# Patient Record
Sex: Female | Born: 1950
Health system: Southern US, Community
[De-identification: ages and names within clinical notes are randomized; demographics above are authoritative.]

## PROBLEM LIST (undated history)

## (undated) DIAGNOSIS — D649 Anemia, unspecified: Secondary | ICD-10-CM

## (undated) DIAGNOSIS — I10 Essential (primary) hypertension: Secondary | ICD-10-CM

## (undated) DIAGNOSIS — D219 Benign neoplasm of connective and other soft tissue, unspecified: Secondary | ICD-10-CM

## (undated) DIAGNOSIS — Z8742 Personal history of other diseases of the female genital tract: Secondary | ICD-10-CM

## (undated) DIAGNOSIS — N951 Menopausal and female climacteric states: Secondary | ICD-10-CM

## (undated) DIAGNOSIS — R011 Cardiac murmur, unspecified: Secondary | ICD-10-CM

## (undated) HISTORY — DX: Essential (primary) hypertension: I10

## (undated) HISTORY — DX: Cardiac murmur, unspecified: R01.1

## (undated) HISTORY — DX: Anemia, unspecified: D64.9

## (undated) HISTORY — DX: Menopausal and female climacteric states: N95.1

## (undated) HISTORY — DX: Benign neoplasm of connective and other soft tissue, unspecified: D21.9

## (undated) HISTORY — DX: Personal history of other diseases of the female genital tract: Z87.42

---

## 1988-04-28 HISTORY — PX: MYOMECTOMY: SHX85

## 1994-04-28 DIAGNOSIS — R011 Cardiac murmur, unspecified: Secondary | ICD-10-CM

## 1994-04-28 HISTORY — DX: Cardiac murmur, unspecified: R01.1

## 1997-10-19 ENCOUNTER — Other Ambulatory Visit: Admission: RE | Admit: 1997-10-19 | Discharge: 1997-10-19 | Payer: Self-pay | Admitting: Obstetrics and Gynecology

## 1998-02-23 ENCOUNTER — Encounter: Payer: Self-pay | Admitting: Obstetrics and Gynecology

## 1998-02-23 ENCOUNTER — Ambulatory Visit (HOSPITAL_COMMUNITY): Admission: RE | Admit: 1998-02-23 | Discharge: 1998-02-23 | Payer: Self-pay | Admitting: Obstetrics and Gynecology

## 1998-03-09 ENCOUNTER — Ambulatory Visit (HOSPITAL_COMMUNITY): Admission: RE | Admit: 1998-03-09 | Discharge: 1998-03-09 | Payer: Self-pay | Admitting: Obstetrics and Gynecology

## 1998-03-09 ENCOUNTER — Encounter: Payer: Self-pay | Admitting: Obstetrics and Gynecology

## 1998-09-21 ENCOUNTER — Ambulatory Visit (HOSPITAL_COMMUNITY): Admission: RE | Admit: 1998-09-21 | Discharge: 1998-09-21 | Payer: Self-pay | Admitting: Obstetrics and Gynecology

## 1999-01-07 ENCOUNTER — Other Ambulatory Visit: Admission: RE | Admit: 1999-01-07 | Discharge: 1999-01-07 | Payer: Self-pay | Admitting: Obstetrics and Gynecology

## 1999-04-29 DIAGNOSIS — N951 Menopausal and female climacteric states: Secondary | ICD-10-CM

## 1999-04-29 HISTORY — DX: Menopausal and female climacteric states: N95.1

## 2000-01-28 ENCOUNTER — Other Ambulatory Visit: Admission: RE | Admit: 2000-01-28 | Discharge: 2000-01-28 | Payer: Self-pay | Admitting: Obstetrics and Gynecology

## 2000-02-26 ENCOUNTER — Encounter: Payer: Self-pay | Admitting: Obstetrics and Gynecology

## 2000-02-26 ENCOUNTER — Ambulatory Visit (HOSPITAL_COMMUNITY): Admission: RE | Admit: 2000-02-26 | Discharge: 2000-02-26 | Payer: Self-pay | Admitting: Family Medicine

## 2001-01-27 ENCOUNTER — Other Ambulatory Visit: Admission: RE | Admit: 2001-01-27 | Discharge: 2001-01-27 | Payer: Self-pay | Admitting: Obstetrics and Gynecology

## 2001-03-01 ENCOUNTER — Encounter: Payer: Self-pay | Admitting: Obstetrics and Gynecology

## 2001-03-01 ENCOUNTER — Ambulatory Visit (HOSPITAL_COMMUNITY): Admission: RE | Admit: 2001-03-01 | Discharge: 2001-03-01 | Payer: Self-pay | Admitting: Obstetrics and Gynecology

## 2002-01-28 ENCOUNTER — Other Ambulatory Visit: Admission: RE | Admit: 2002-01-28 | Discharge: 2002-01-28 | Payer: Self-pay | Admitting: Obstetrics and Gynecology

## 2002-03-08 ENCOUNTER — Ambulatory Visit (HOSPITAL_COMMUNITY): Admission: RE | Admit: 2002-03-08 | Discharge: 2002-03-08 | Payer: Self-pay | Admitting: Obstetrics and Gynecology

## 2002-03-08 ENCOUNTER — Encounter: Payer: Self-pay | Admitting: Obstetrics and Gynecology

## 2003-03-15 ENCOUNTER — Ambulatory Visit (HOSPITAL_COMMUNITY): Admission: RE | Admit: 2003-03-15 | Discharge: 2003-03-15 | Payer: Self-pay | Admitting: Obstetrics and Gynecology

## 2004-03-19 ENCOUNTER — Ambulatory Visit (HOSPITAL_COMMUNITY): Admission: RE | Admit: 2004-03-19 | Discharge: 2004-03-19 | Payer: Self-pay | Admitting: Obstetrics and Gynecology

## 2004-04-03 ENCOUNTER — Other Ambulatory Visit: Admission: RE | Admit: 2004-04-03 | Discharge: 2004-04-03 | Payer: Self-pay | Admitting: Obstetrics and Gynecology

## 2005-04-07 ENCOUNTER — Other Ambulatory Visit: Admission: RE | Admit: 2005-04-07 | Discharge: 2005-04-07 | Payer: Self-pay | Admitting: Obstetrics and Gynecology

## 2005-04-14 ENCOUNTER — Ambulatory Visit (HOSPITAL_COMMUNITY): Admission: RE | Admit: 2005-04-14 | Discharge: 2005-04-14 | Payer: Self-pay | Admitting: Obstetrics and Gynecology

## 2005-05-07 ENCOUNTER — Encounter: Admission: RE | Admit: 2005-05-07 | Discharge: 2005-05-07 | Payer: Self-pay | Admitting: Obstetrics and Gynecology

## 2005-12-10 ENCOUNTER — Encounter: Admission: RE | Admit: 2005-12-10 | Discharge: 2005-12-10 | Payer: Self-pay | Admitting: Obstetrics and Gynecology

## 2006-08-10 ENCOUNTER — Encounter: Admission: RE | Admit: 2006-08-10 | Discharge: 2006-08-10 | Payer: Self-pay | Admitting: Obstetrics and Gynecology

## 2006-12-04 ENCOUNTER — Emergency Department (HOSPITAL_COMMUNITY): Admission: EM | Admit: 2006-12-04 | Discharge: 2006-12-04 | Payer: Self-pay | Admitting: Emergency Medicine

## 2007-08-16 ENCOUNTER — Encounter: Admission: RE | Admit: 2007-08-16 | Discharge: 2007-08-16 | Payer: Self-pay | Admitting: Obstetrics and Gynecology

## 2008-08-29 ENCOUNTER — Encounter: Admission: RE | Admit: 2008-08-29 | Discharge: 2008-08-29 | Payer: Self-pay | Admitting: Obstetrics and Gynecology

## 2009-08-31 ENCOUNTER — Encounter: Admission: RE | Admit: 2009-08-31 | Discharge: 2009-08-31 | Payer: Self-pay | Admitting: Obstetrics and Gynecology

## 2010-05-19 ENCOUNTER — Encounter: Payer: Self-pay | Admitting: Obstetrics and Gynecology

## 2010-08-13 ENCOUNTER — Other Ambulatory Visit: Payer: Self-pay | Admitting: Obstetrics and Gynecology

## 2010-08-13 DIAGNOSIS — Z1231 Encounter for screening mammogram for malignant neoplasm of breast: Secondary | ICD-10-CM

## 2010-09-24 ENCOUNTER — Ambulatory Visit
Admission: RE | Admit: 2010-09-24 | Discharge: 2010-09-24 | Disposition: A | Discharge status: Home / Self Care | Payer: BC Managed Care – PPO | Source: Ambulatory Visit | Attending: Obstetrics and Gynecology | Admitting: Obstetrics and Gynecology

## 2010-09-24 DIAGNOSIS — Z1231 Encounter for screening mammogram for malignant neoplasm of breast: Secondary | ICD-10-CM

## 2011-08-11 ENCOUNTER — Encounter: Payer: Self-pay | Admitting: Obstetrics and Gynecology

## 2011-08-11 ENCOUNTER — Ambulatory Visit (INDEPENDENT_AMBULATORY_CARE_PROVIDER_SITE_OTHER): Payer: BC Managed Care – PPO | Admitting: Obstetrics and Gynecology

## 2011-08-11 VITALS — BP 130/74 | Resp 16 | Ht 60.0 in | Wt 112.0 lb

## 2011-08-11 DIAGNOSIS — I10 Essential (primary) hypertension: Secondary | ICD-10-CM

## 2011-08-11 DIAGNOSIS — Z1231 Encounter for screening mammogram for malignant neoplasm of breast: Secondary | ICD-10-CM

## 2011-08-11 DIAGNOSIS — Z01419 Encounter for gynecological examination (general) (routine) without abnormal findings: Secondary | ICD-10-CM

## 2011-08-11 DIAGNOSIS — N951 Menopausal and female climacteric states: Secondary | ICD-10-CM

## 2011-08-11 NOTE — Progress Notes (Signed)
Contraception: Meno Last pap: 08/08/10 Last Mammo 09/24/2010 Last Colonoscopy: None Last Dexa Scan: None Primary MD: Ok Anis Abuse at Home: No

## 2011-08-11 NOTE — Progress Notes (Signed)
Subjective:    Paula Harper is a 61 y.o. female who presents for annual exam. The patient has no complaints today. The patient is sexually active. GYN screening history: last pap: was normal and last mammogram: was normal. The patient is not taking hormone replacement therapy. Patient denies post-menopausal vaginal bleeding.. The patient wears seatbelts: yes. The patient participates in regular exercise: yes. Has the patient ever been transfused or tattooed?: no. The patient reports that there is not domestic violence in her life.   Menstrual History: OB History    Grav Para Term Preterm Abortions TAB SAB Ect Mult Living                  Menarche age: 30 No LMP recorded. Patient is postmenopausal.    The following portions of the patient's history were reviewed and updated as appropriate: allergies, current medications, past family history, past medical history, past social history, past surgical history and problem list.  Review of Systems A comprehensive review of systems was negative.    Objective:    BP 130/74  Resp 16  Ht 5' (1.524 m)  Wt 112 lb (50.803 kg)  BMI 21.87 kg/m2  General Appearance:    Alert, cooperative, no distress, appears stated age  Head:    Normocephalic, without obvious abnormality, atraumatic  Eyes:    PERRL, conjunctiva/corneas clear, EOM's intact, fundi    benign, both eyes  Ears:    Normal TM's and external ear canals, both ears  Nose:   Nares normal, septum midline, mucosa normal, no drainage    or sinus tenderness  Throat:   Lips, mucosa, and tongue normal; teeth and gums normal  Neck:   Supple, symmetrical, trachea midline, no adenopathy;    thyroid:  no enlargement/tenderness/nodules; no carotid   bruit or JVD  Back:     Symmetric, no curvature, ROM normal, no CVA tenderness  Lungs:     Clear to auscultation bilaterally, respirations unlabored  Chest Wall:    No tenderness or deformity   Heart:    Regular rate and rhythm, S1 and S2 normal, no  murmur, rub   or gallop  Breast Exam:    No tenderness, masses, or nipple abnormality  Abdomen:     Soft, non-tender, bowel sounds active all four quadrants,    no masses, no organomegaly  Genitalia:    Normal female without lesion, discharge or tenderness  Rectal:    Normal tone, normal prostate, no masses or tenderness;   guaiac negative stool  Extremities:   Extremities normal, atraumatic, no cyanosis or edema  Pulses:   2+ and symmetric all extremities  Skin:   Skin color, texture, turgor normal, no rashes or lesions  Lymph nodes:   Cervical, supraclavicular, and axillary nodes normal  Neurologic:   CNII-XII intact, normal strength, sensation and reflexes    throughout      Assessment:    Normal gyn exam    Plan:    Follow up as needed. Mammogram. Diet and exercise reviewed.  Needs sigmoidoscopy.

## 2011-09-29 ENCOUNTER — Ambulatory Visit
Admission: RE | Admit: 2011-09-29 | Discharge: 2011-09-29 | Disposition: A | Discharge status: Home / Self Care | Payer: BC Managed Care – PPO | Source: Ambulatory Visit | Attending: Obstetrics and Gynecology | Admitting: Obstetrics and Gynecology

## 2011-09-29 DIAGNOSIS — Z1231 Encounter for screening mammogram for malignant neoplasm of breast: Secondary | ICD-10-CM

## 2011-10-02 ENCOUNTER — Other Ambulatory Visit: Payer: Self-pay | Admitting: Obstetrics and Gynecology

## 2011-10-02 DIAGNOSIS — R928 Other abnormal and inconclusive findings on diagnostic imaging of breast: Secondary | ICD-10-CM

## 2011-10-14 ENCOUNTER — Ambulatory Visit
Admission: RE | Admit: 2011-10-14 | Discharge: 2011-10-14 | Disposition: A | Discharge status: Home / Self Care | Payer: BC Managed Care – PPO | Source: Ambulatory Visit | Attending: Obstetrics and Gynecology | Admitting: Obstetrics and Gynecology

## 2011-10-14 DIAGNOSIS — R928 Other abnormal and inconclusive findings on diagnostic imaging of breast: Secondary | ICD-10-CM

## 2012-10-05 ENCOUNTER — Other Ambulatory Visit: Payer: Self-pay

## 2012-10-05 DIAGNOSIS — Z1231 Encounter for screening mammogram for malignant neoplasm of breast: Secondary | ICD-10-CM

## 2012-11-22 ENCOUNTER — Ambulatory Visit
Admission: RE | Admit: 2012-11-22 | Discharge: 2012-11-22 | Disposition: A | Discharge status: Home / Self Care | Payer: BC Managed Care – PPO | Source: Ambulatory Visit

## 2012-11-22 DIAGNOSIS — Z1231 Encounter for screening mammogram for malignant neoplasm of breast: Secondary | ICD-10-CM

## 2013-10-31 ENCOUNTER — Other Ambulatory Visit: Payer: Self-pay

## 2013-10-31 DIAGNOSIS — Z1231 Encounter for screening mammogram for malignant neoplasm of breast: Secondary | ICD-10-CM

## 2013-12-20 ENCOUNTER — Ambulatory Visit
Admission: RE | Admit: 2013-12-20 | Discharge: 2013-12-20 | Disposition: A | Discharge status: Home / Self Care | Payer: 59 | Source: Ambulatory Visit

## 2013-12-20 DIAGNOSIS — Z1231 Encounter for screening mammogram for malignant neoplasm of breast: Secondary | ICD-10-CM

## 2014-11-15 ENCOUNTER — Other Ambulatory Visit: Payer: Self-pay

## 2014-11-15 DIAGNOSIS — Z1231 Encounter for screening mammogram for malignant neoplasm of breast: Secondary | ICD-10-CM

## 2014-12-25 ENCOUNTER — Ambulatory Visit
Admission: RE | Admit: 2014-12-25 | Discharge: 2014-12-25 | Disposition: A | Discharge status: Home / Self Care | Payer: Medicare Other | Source: Ambulatory Visit

## 2014-12-25 DIAGNOSIS — Z1231 Encounter for screening mammogram for malignant neoplasm of breast: Secondary | ICD-10-CM

## 2015-05-15 DIAGNOSIS — E876 Hypokalemia: Secondary | ICD-10-CM | POA: Diagnosis not present

## 2015-05-15 DIAGNOSIS — Z1211 Encounter for screening for malignant neoplasm of colon: Secondary | ICD-10-CM | POA: Diagnosis not present

## 2015-05-15 DIAGNOSIS — N951 Menopausal and female climacteric states: Secondary | ICD-10-CM | POA: Diagnosis not present

## 2015-05-15 DIAGNOSIS — I10 Essential (primary) hypertension: Secondary | ICD-10-CM | POA: Diagnosis not present

## 2015-05-15 DIAGNOSIS — Z23 Encounter for immunization: Secondary | ICD-10-CM | POA: Diagnosis not present

## 2015-06-13 DIAGNOSIS — Z23 Encounter for immunization: Secondary | ICD-10-CM | POA: Diagnosis not present

## 2015-06-14 DIAGNOSIS — Z1211 Encounter for screening for malignant neoplasm of colon: Secondary | ICD-10-CM | POA: Diagnosis not present

## 2015-06-19 DIAGNOSIS — M8589 Other specified disorders of bone density and structure, multiple sites: Secondary | ICD-10-CM | POA: Diagnosis not present

## 2015-06-19 DIAGNOSIS — Z780 Asymptomatic menopausal state: Secondary | ICD-10-CM | POA: Diagnosis not present

## 2016-01-24 ENCOUNTER — Other Ambulatory Visit: Payer: Self-pay | Admitting: Obstetrics and Gynecology

## 2016-01-24 DIAGNOSIS — Z1231 Encounter for screening mammogram for malignant neoplasm of breast: Secondary | ICD-10-CM

## 2016-02-19 ENCOUNTER — Ambulatory Visit
Admission: RE | Admit: 2016-02-19 | Discharge: 2016-02-19 | Disposition: A | Discharge status: Home / Self Care | Payer: BC Managed Care – PPO | Source: Ambulatory Visit | Attending: Obstetrics and Gynecology | Admitting: Obstetrics and Gynecology

## 2016-02-19 DIAGNOSIS — Z1231 Encounter for screening mammogram for malignant neoplasm of breast: Secondary | ICD-10-CM

## 2016-02-20 DIAGNOSIS — H11423 Conjunctival edema, bilateral: Secondary | ICD-10-CM | POA: Diagnosis not present

## 2016-02-20 DIAGNOSIS — H11153 Pinguecula, bilateral: Secondary | ICD-10-CM | POA: Diagnosis not present

## 2016-02-20 DIAGNOSIS — H04123 Dry eye syndrome of bilateral lacrimal glands: Secondary | ICD-10-CM | POA: Diagnosis not present

## 2016-02-20 DIAGNOSIS — H3589 Other specified retinal disorders: Secondary | ICD-10-CM | POA: Diagnosis not present

## 2016-02-20 DIAGNOSIS — H25013 Cortical age-related cataract, bilateral: Secondary | ICD-10-CM | POA: Diagnosis not present

## 2016-02-20 DIAGNOSIS — H52223 Regular astigmatism, bilateral: Secondary | ICD-10-CM | POA: Diagnosis not present

## 2016-02-20 DIAGNOSIS — I10 Essential (primary) hypertension: Secondary | ICD-10-CM | POA: Diagnosis not present

## 2016-02-20 DIAGNOSIS — H5213 Myopia, bilateral: Secondary | ICD-10-CM | POA: Diagnosis not present

## 2016-02-20 DIAGNOSIS — H18413 Arcus senilis, bilateral: Secondary | ICD-10-CM | POA: Diagnosis not present

## 2016-02-20 DIAGNOSIS — H25043 Posterior subcapsular polar age-related cataract, bilateral: Secondary | ICD-10-CM | POA: Diagnosis not present

## 2016-02-20 DIAGNOSIS — H40013 Open angle with borderline findings, low risk, bilateral: Secondary | ICD-10-CM | POA: Diagnosis not present

## 2016-02-20 DIAGNOSIS — H35033 Hypertensive retinopathy, bilateral: Secondary | ICD-10-CM | POA: Diagnosis not present

## 2016-03-27 DIAGNOSIS — R634 Abnormal weight loss: Secondary | ICD-10-CM | POA: Diagnosis not present

## 2016-03-27 DIAGNOSIS — Z124 Encounter for screening for malignant neoplasm of cervix: Secondary | ICD-10-CM | POA: Diagnosis not present

## 2016-03-27 DIAGNOSIS — Z6823 Body mass index (BMI) 23.0-23.9, adult: Secondary | ICD-10-CM | POA: Diagnosis not present

## 2016-05-20 DIAGNOSIS — Z23 Encounter for immunization: Secondary | ICD-10-CM | POA: Diagnosis not present

## 2016-05-20 DIAGNOSIS — J301 Allergic rhinitis due to pollen: Secondary | ICD-10-CM | POA: Diagnosis not present

## 2016-05-20 DIAGNOSIS — E876 Hypokalemia: Secondary | ICD-10-CM | POA: Diagnosis not present

## 2016-05-20 DIAGNOSIS — Z79899 Other long term (current) drug therapy: Secondary | ICD-10-CM | POA: Diagnosis not present

## 2016-05-20 DIAGNOSIS — I10 Essential (primary) hypertension: Secondary | ICD-10-CM | POA: Diagnosis not present

## 2016-05-20 DIAGNOSIS — Z0000 Encounter for general adult medical examination without abnormal findings: Secondary | ICD-10-CM | POA: Diagnosis not present

## 2016-05-20 DIAGNOSIS — Z13220 Encounter for screening for lipoid disorders: Secondary | ICD-10-CM | POA: Diagnosis not present

## 2016-06-23 DIAGNOSIS — Z1211 Encounter for screening for malignant neoplasm of colon: Secondary | ICD-10-CM | POA: Diagnosis not present

## 2017-01-15 ENCOUNTER — Other Ambulatory Visit: Payer: Self-pay | Admitting: Obstetrics and Gynecology

## 2017-01-15 DIAGNOSIS — Z1231 Encounter for screening mammogram for malignant neoplasm of breast: Secondary | ICD-10-CM

## 2017-02-24 ENCOUNTER — Ambulatory Visit
Admission: RE | Admit: 2017-02-24 | Discharge: 2017-02-24 | Disposition: A | Discharge status: Home / Self Care | Payer: BC Managed Care – PPO | Source: Ambulatory Visit | Attending: Obstetrics and Gynecology | Admitting: Obstetrics and Gynecology

## 2017-02-24 DIAGNOSIS — Z1231 Encounter for screening mammogram for malignant neoplasm of breast: Secondary | ICD-10-CM | POA: Diagnosis not present

## 2017-03-25 DIAGNOSIS — H40003 Preglaucoma, unspecified, bilateral: Secondary | ICD-10-CM | POA: Diagnosis not present

## 2017-03-25 DIAGNOSIS — H25013 Cortical age-related cataract, bilateral: Secondary | ICD-10-CM | POA: Diagnosis not present

## 2017-03-25 DIAGNOSIS — H18413 Arcus senilis, bilateral: Secondary | ICD-10-CM | POA: Diagnosis not present

## 2017-03-25 DIAGNOSIS — H52223 Regular astigmatism, bilateral: Secondary | ICD-10-CM | POA: Diagnosis not present

## 2017-03-25 DIAGNOSIS — H11423 Conjunctival edema, bilateral: Secondary | ICD-10-CM | POA: Diagnosis not present

## 2017-03-25 DIAGNOSIS — H524 Presbyopia: Secondary | ICD-10-CM | POA: Diagnosis not present

## 2017-03-25 DIAGNOSIS — H04123 Dry eye syndrome of bilateral lacrimal glands: Secondary | ICD-10-CM | POA: Diagnosis not present

## 2017-03-25 DIAGNOSIS — H5213 Myopia, bilateral: Secondary | ICD-10-CM | POA: Diagnosis not present

## 2017-03-25 DIAGNOSIS — H40013 Open angle with borderline findings, low risk, bilateral: Secondary | ICD-10-CM | POA: Diagnosis not present

## 2017-03-25 DIAGNOSIS — H2513 Age-related nuclear cataract, bilateral: Secondary | ICD-10-CM | POA: Diagnosis not present

## 2017-03-25 DIAGNOSIS — H11153 Pinguecula, bilateral: Secondary | ICD-10-CM | POA: Diagnosis not present

## 2017-03-25 DIAGNOSIS — H353111 Nonexudative age-related macular degeneration, right eye, early dry stage: Secondary | ICD-10-CM | POA: Diagnosis not present

## 2017-03-31 DIAGNOSIS — Z01411 Encounter for gynecological examination (general) (routine) with abnormal findings: Secondary | ICD-10-CM | POA: Diagnosis not present

## 2017-03-31 DIAGNOSIS — Z124 Encounter for screening for malignant neoplasm of cervix: Secondary | ICD-10-CM | POA: Diagnosis not present

## 2017-03-31 DIAGNOSIS — Z6823 Body mass index (BMI) 23.0-23.9, adult: Secondary | ICD-10-CM | POA: Diagnosis not present

## 2017-05-25 DIAGNOSIS — E7800 Pure hypercholesterolemia, unspecified: Secondary | ICD-10-CM | POA: Diagnosis not present

## 2017-05-25 DIAGNOSIS — I10 Essential (primary) hypertension: Secondary | ICD-10-CM | POA: Diagnosis not present

## 2017-05-25 DIAGNOSIS — Z79899 Other long term (current) drug therapy: Secondary | ICD-10-CM | POA: Diagnosis not present

## 2017-05-25 DIAGNOSIS — Z0001 Encounter for general adult medical examination with abnormal findings: Secondary | ICD-10-CM | POA: Diagnosis not present

## 2017-05-25 DIAGNOSIS — E876 Hypokalemia: Secondary | ICD-10-CM | POA: Diagnosis not present

## 2017-08-03 DIAGNOSIS — E7800 Pure hypercholesterolemia, unspecified: Secondary | ICD-10-CM | POA: Diagnosis not present

## 2017-08-03 DIAGNOSIS — I10 Essential (primary) hypertension: Secondary | ICD-10-CM | POA: Diagnosis not present

## 2017-08-03 DIAGNOSIS — Z79899 Other long term (current) drug therapy: Secondary | ICD-10-CM | POA: Diagnosis not present

## 2018-01-15 ENCOUNTER — Other Ambulatory Visit: Payer: Self-pay | Admitting: Obstetrics and Gynecology

## 2018-01-15 DIAGNOSIS — Z1231 Encounter for screening mammogram for malignant neoplasm of breast: Secondary | ICD-10-CM

## 2018-03-02 ENCOUNTER — Other Ambulatory Visit: Payer: Self-pay | Admitting: Obstetrics and Gynecology

## 2018-03-02 ENCOUNTER — Ambulatory Visit
Admission: RE | Admit: 2018-03-02 | Discharge: 2018-03-02 | Disposition: A | Discharge status: Home / Self Care | Payer: Medicare Other | Source: Ambulatory Visit | Attending: Obstetrics and Gynecology | Admitting: Obstetrics and Gynecology

## 2018-03-02 DIAGNOSIS — Z1231 Encounter for screening mammogram for malignant neoplasm of breast: Secondary | ICD-10-CM

## 2018-03-23 DIAGNOSIS — H25013 Cortical age-related cataract, bilateral: Secondary | ICD-10-CM | POA: Diagnosis not present

## 2018-03-23 DIAGNOSIS — H18413 Arcus senilis, bilateral: Secondary | ICD-10-CM | POA: Diagnosis not present

## 2018-03-23 DIAGNOSIS — H0102A Squamous blepharitis right eye, upper and lower eyelids: Secondary | ICD-10-CM | POA: Diagnosis not present

## 2018-03-23 DIAGNOSIS — H0102B Squamous blepharitis left eye, upper and lower eyelids: Secondary | ICD-10-CM | POA: Diagnosis not present

## 2018-03-23 DIAGNOSIS — H5213 Myopia, bilateral: Secondary | ICD-10-CM | POA: Diagnosis not present

## 2018-03-23 DIAGNOSIS — H52223 Regular astigmatism, bilateral: Secondary | ICD-10-CM | POA: Diagnosis not present

## 2018-03-23 DIAGNOSIS — H40003 Preglaucoma, unspecified, bilateral: Secondary | ICD-10-CM | POA: Diagnosis not present

## 2018-03-23 DIAGNOSIS — H353111 Nonexudative age-related macular degeneration, right eye, early dry stage: Secondary | ICD-10-CM | POA: Diagnosis not present

## 2018-03-23 DIAGNOSIS — H40023 Open angle with borderline findings, high risk, bilateral: Secondary | ICD-10-CM | POA: Diagnosis not present

## 2018-03-23 DIAGNOSIS — H2513 Age-related nuclear cataract, bilateral: Secondary | ICD-10-CM | POA: Diagnosis not present

## 2018-03-23 DIAGNOSIS — H04123 Dry eye syndrome of bilateral lacrimal glands: Secondary | ICD-10-CM | POA: Diagnosis not present

## 2018-03-23 DIAGNOSIS — H11153 Pinguecula, bilateral: Secondary | ICD-10-CM | POA: Diagnosis not present

## 2018-04-06 DIAGNOSIS — Z6823 Body mass index (BMI) 23.0-23.9, adult: Secondary | ICD-10-CM | POA: Diagnosis not present

## 2018-04-06 DIAGNOSIS — Z124 Encounter for screening for malignant neoplasm of cervix: Secondary | ICD-10-CM | POA: Diagnosis not present

## 2018-04-06 DIAGNOSIS — Z01411 Encounter for gynecological examination (general) (routine) with abnormal findings: Secondary | ICD-10-CM | POA: Diagnosis not present

## 2018-04-08 DIAGNOSIS — Z23 Encounter for immunization: Secondary | ICD-10-CM | POA: Diagnosis not present

## 2018-04-23 DIAGNOSIS — H938X3 Other specified disorders of ear, bilateral: Secondary | ICD-10-CM | POA: Diagnosis not present

## 2018-04-23 DIAGNOSIS — H9192 Unspecified hearing loss, left ear: Secondary | ICD-10-CM | POA: Diagnosis not present

## 2018-04-23 DIAGNOSIS — H6123 Impacted cerumen, bilateral: Secondary | ICD-10-CM | POA: Diagnosis not present

## 2018-05-31 DIAGNOSIS — Z0001 Encounter for general adult medical examination with abnormal findings: Secondary | ICD-10-CM | POA: Diagnosis not present

## 2018-05-31 DIAGNOSIS — I10 Essential (primary) hypertension: Secondary | ICD-10-CM | POA: Diagnosis not present

## 2018-05-31 DIAGNOSIS — E7800 Pure hypercholesterolemia, unspecified: Secondary | ICD-10-CM | POA: Diagnosis not present

## 2018-05-31 DIAGNOSIS — H6121 Impacted cerumen, right ear: Secondary | ICD-10-CM | POA: Diagnosis not present

## 2018-06-24 DIAGNOSIS — Z0001 Encounter for general adult medical examination with abnormal findings: Secondary | ICD-10-CM | POA: Diagnosis not present

## 2018-11-19 DIAGNOSIS — H40003 Preglaucoma, unspecified, bilateral: Secondary | ICD-10-CM | POA: Diagnosis not present

## 2018-11-19 DIAGNOSIS — H25013 Cortical age-related cataract, bilateral: Secondary | ICD-10-CM | POA: Diagnosis not present

## 2018-11-19 DIAGNOSIS — H0102B Squamous blepharitis left eye, upper and lower eyelids: Secondary | ICD-10-CM | POA: Diagnosis not present

## 2018-11-19 DIAGNOSIS — H353111 Nonexudative age-related macular degeneration, right eye, early dry stage: Secondary | ICD-10-CM | POA: Diagnosis not present

## 2018-11-19 DIAGNOSIS — H0102A Squamous blepharitis right eye, upper and lower eyelids: Secondary | ICD-10-CM | POA: Diagnosis not present

## 2018-11-19 DIAGNOSIS — H2513 Age-related nuclear cataract, bilateral: Secondary | ICD-10-CM | POA: Diagnosis not present

## 2018-11-19 DIAGNOSIS — H40023 Open angle with borderline findings, high risk, bilateral: Secondary | ICD-10-CM | POA: Diagnosis not present

## 2018-11-19 DIAGNOSIS — H04123 Dry eye syndrome of bilateral lacrimal glands: Secondary | ICD-10-CM | POA: Diagnosis not present

## 2018-11-19 DIAGNOSIS — H18413 Arcus senilis, bilateral: Secondary | ICD-10-CM | POA: Diagnosis not present

## 2018-11-29 DIAGNOSIS — J301 Allergic rhinitis due to pollen: Secondary | ICD-10-CM | POA: Diagnosis not present

## 2018-11-29 DIAGNOSIS — E7800 Pure hypercholesterolemia, unspecified: Secondary | ICD-10-CM | POA: Diagnosis not present

## 2018-11-29 DIAGNOSIS — E876 Hypokalemia: Secondary | ICD-10-CM | POA: Diagnosis not present

## 2018-11-29 DIAGNOSIS — H6121 Impacted cerumen, right ear: Secondary | ICD-10-CM | POA: Diagnosis not present

## 2018-11-29 DIAGNOSIS — I10 Essential (primary) hypertension: Secondary | ICD-10-CM | POA: Diagnosis not present

## 2019-02-03 ENCOUNTER — Other Ambulatory Visit: Payer: Self-pay | Admitting: Obstetrics and Gynecology

## 2019-02-03 DIAGNOSIS — Z1231 Encounter for screening mammogram for malignant neoplasm of breast: Secondary | ICD-10-CM

## 2019-03-22 ENCOUNTER — Ambulatory Visit: Payer: Medicare Other

## 2019-04-01 ENCOUNTER — Other Ambulatory Visit: Payer: Self-pay

## 2019-04-01 ENCOUNTER — Ambulatory Visit
Admission: RE | Admit: 2019-04-01 | Discharge: 2019-04-01 | Disposition: A | Discharge status: Home / Self Care | Payer: Medicare Other | Source: Ambulatory Visit | Attending: Obstetrics and Gynecology | Admitting: Obstetrics and Gynecology

## 2019-04-01 DIAGNOSIS — Z1231 Encounter for screening mammogram for malignant neoplasm of breast: Secondary | ICD-10-CM

## 2019-04-07 IMAGING — MG DIGITAL SCREENING BILATERAL MAMMOGRAM WITH CAD
4 series · 4 of 4 positions shown · non-contrast
Comparison: Previous exam(s).

CLINICAL DATA: Screening.

EXAM:
DIGITAL SCREENING BILATERAL MAMMOGRAM WITH CAD

[L MLO]
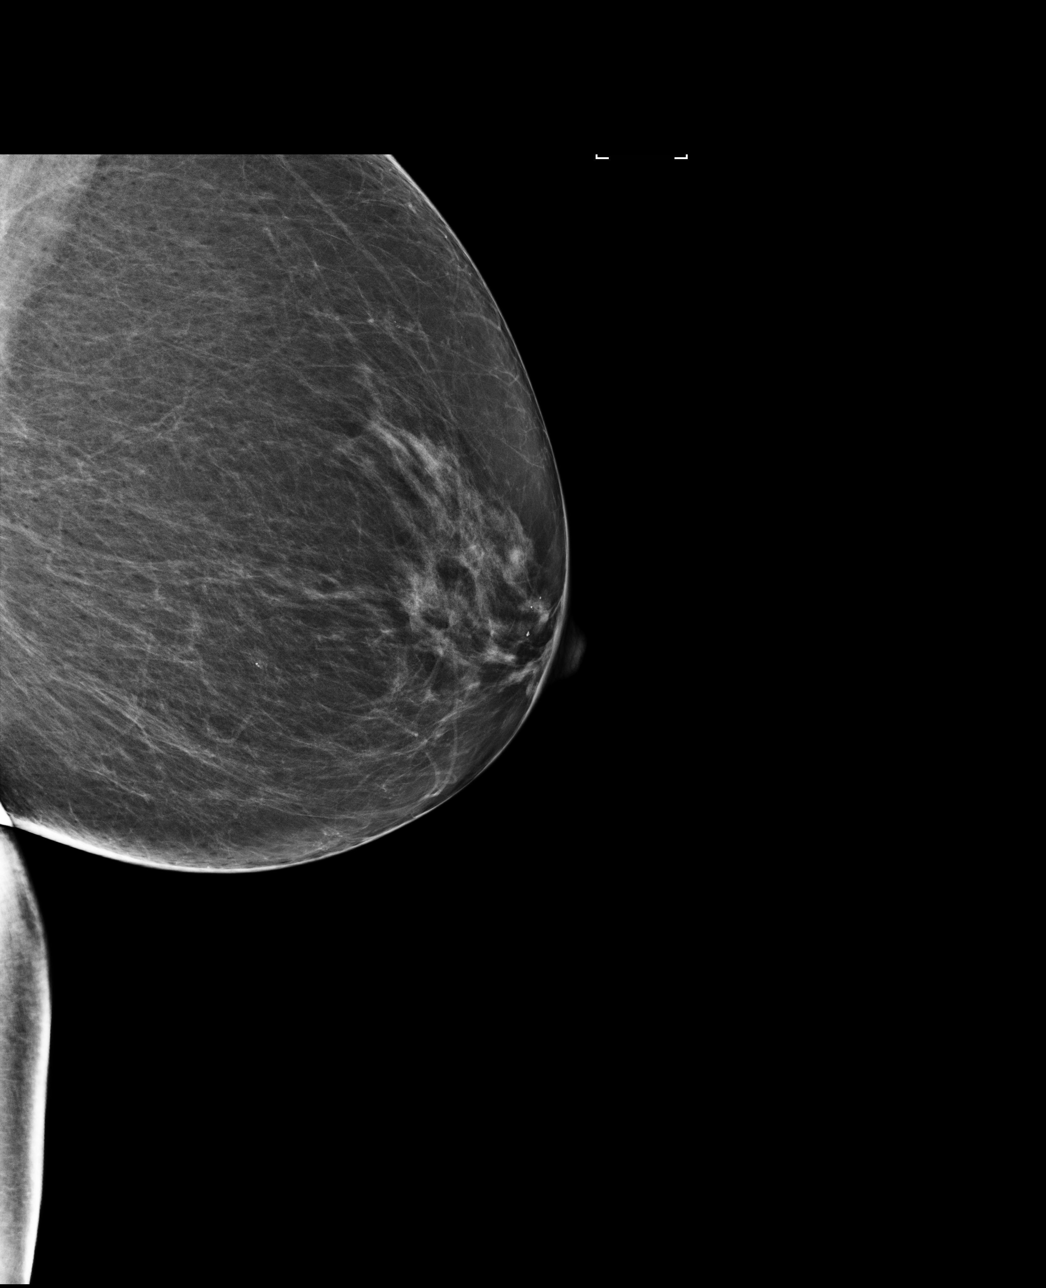

[L CC]
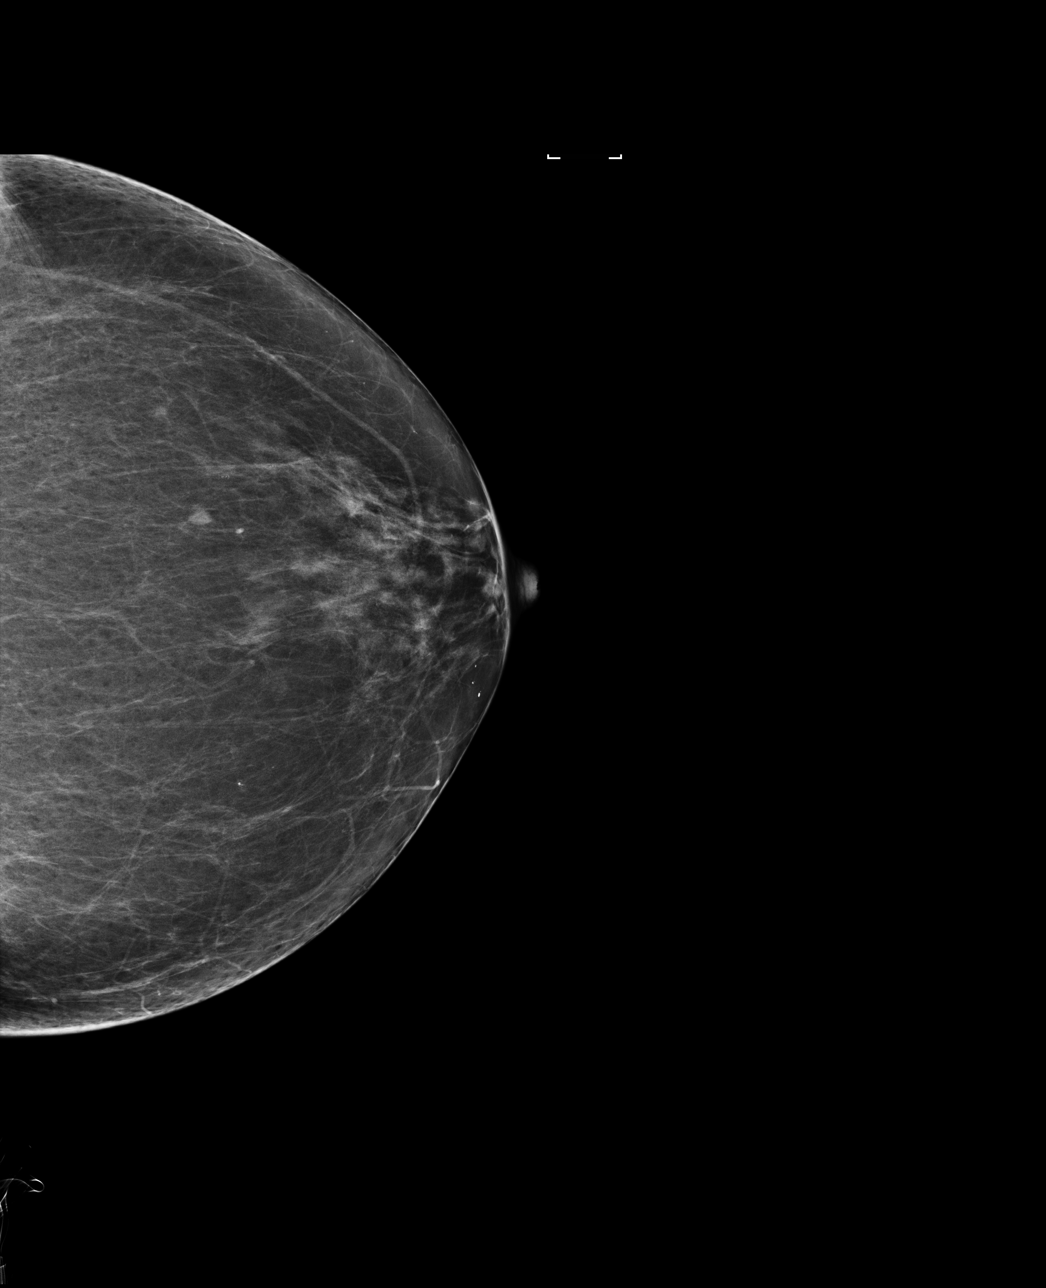

[R CC]
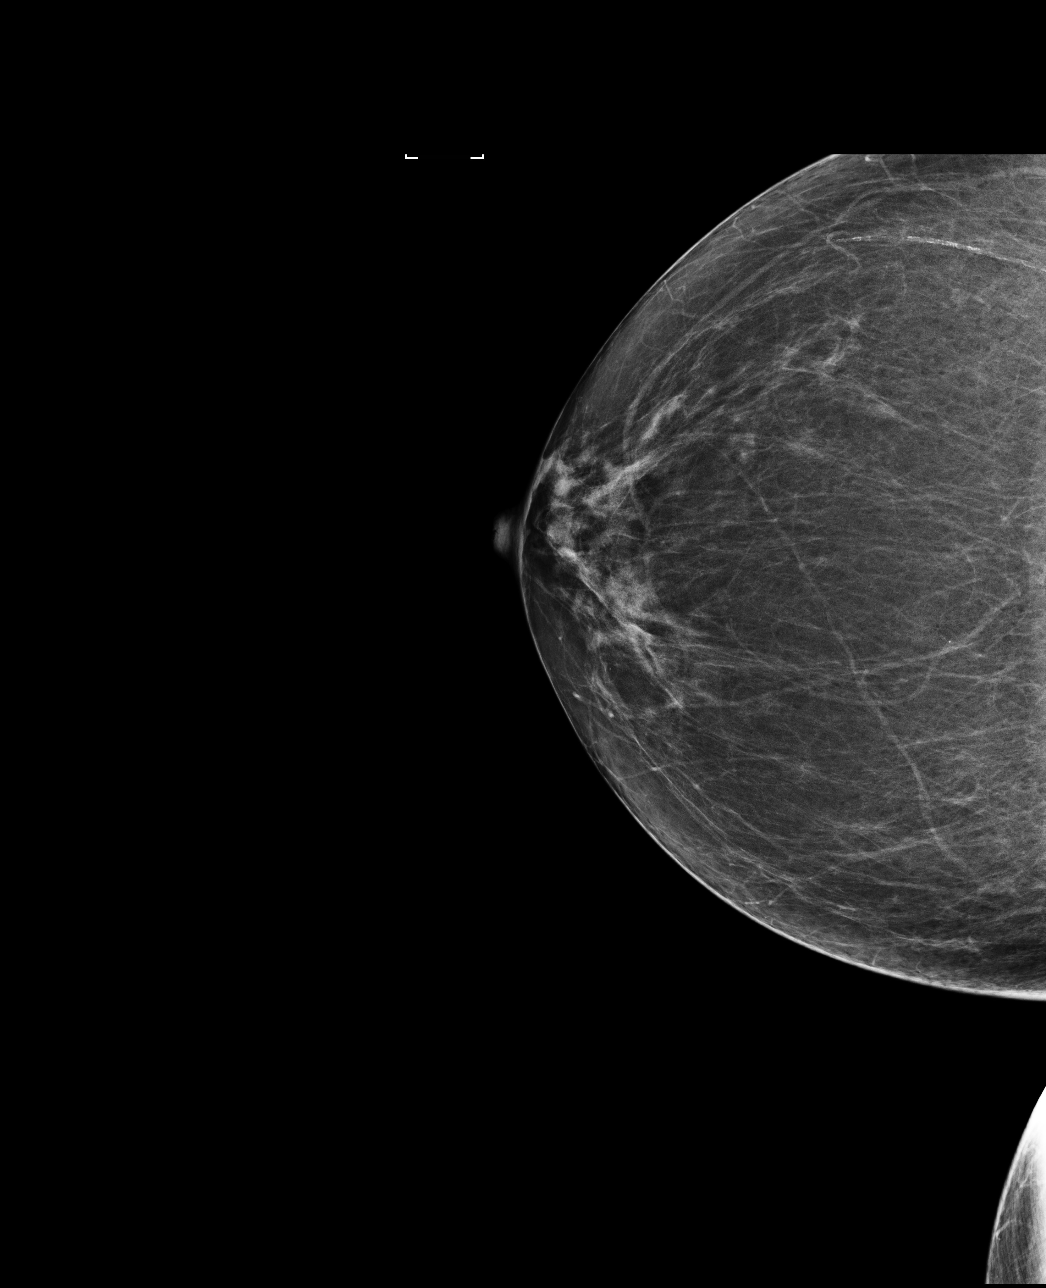

[R MLO]
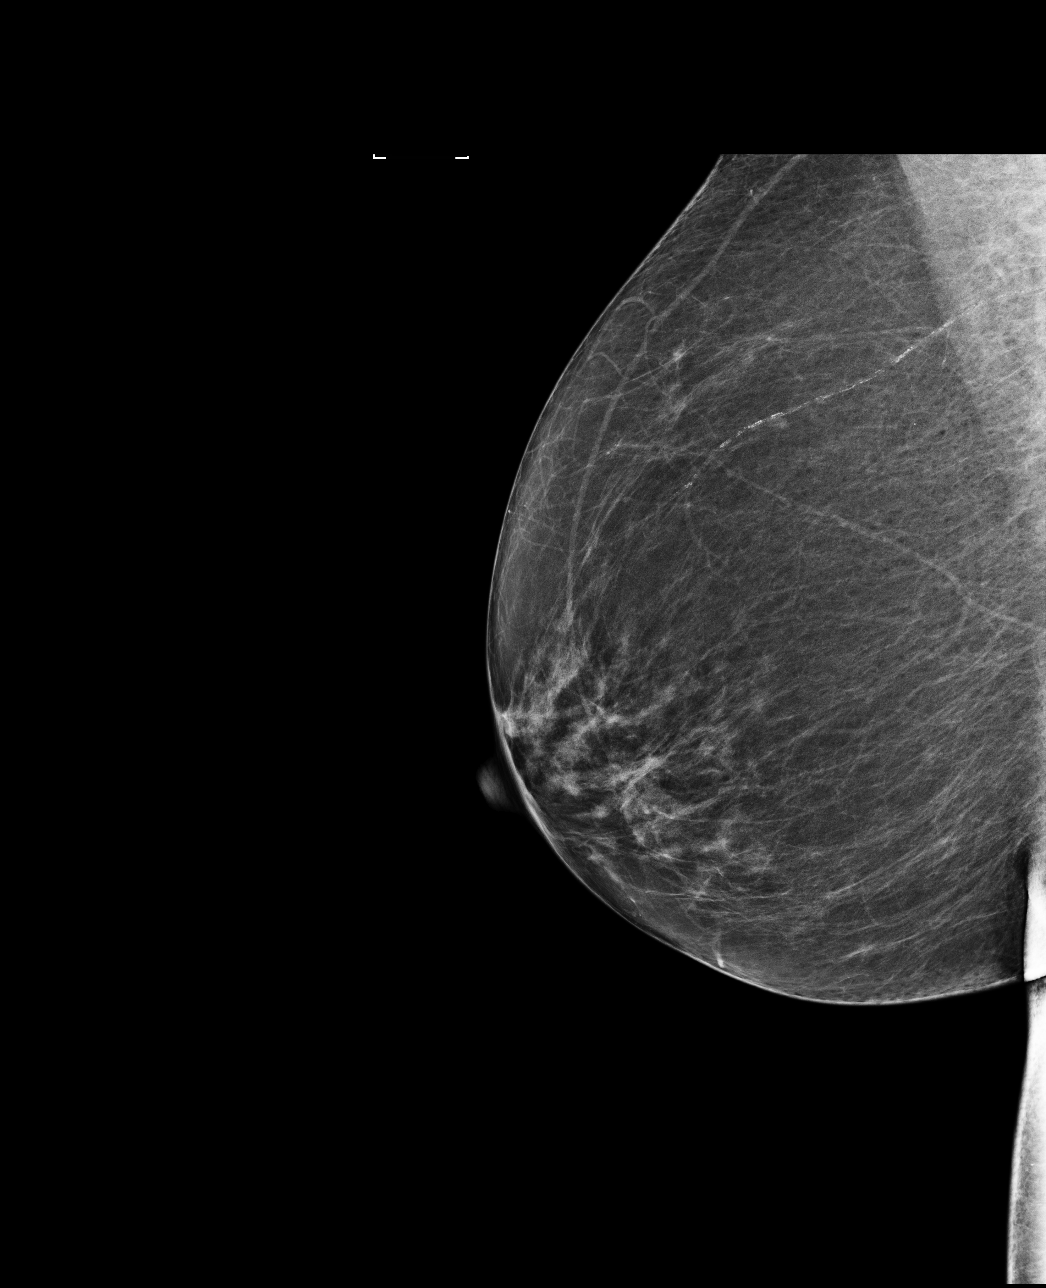

[4 of 4 positions shown; findings below may reference images not displayed]

ACR Breast Density Category b: There are scattered areas of
fibroglandular density.
FINDINGS: There are no findings suspicious for malignancy. Images were
processed with CAD.
IMPRESSION: No mammographic evidence of malignancy. A result letter of this
screening mammogram will be mailed directly to the patient.

RECOMMENDATION:
Screening mammogram in one year. (Code:AS-G-LCT)

BI-RADS CATEGORY  1: Negative.

## 2019-05-18 DIAGNOSIS — Z01419 Encounter for gynecological examination (general) (routine) without abnormal findings: Secondary | ICD-10-CM | POA: Diagnosis not present

## 2019-05-18 DIAGNOSIS — Z124 Encounter for screening for malignant neoplasm of cervix: Secondary | ICD-10-CM | POA: Diagnosis not present

## 2019-06-10 DIAGNOSIS — R7309 Other abnormal glucose: Secondary | ICD-10-CM | POA: Diagnosis not present

## 2019-06-10 DIAGNOSIS — Z79899 Other long term (current) drug therapy: Secondary | ICD-10-CM | POA: Diagnosis not present

## 2019-06-10 DIAGNOSIS — J301 Allergic rhinitis due to pollen: Secondary | ICD-10-CM | POA: Diagnosis not present

## 2019-06-10 DIAGNOSIS — E7800 Pure hypercholesterolemia, unspecified: Secondary | ICD-10-CM | POA: Diagnosis not present

## 2019-06-10 DIAGNOSIS — I10 Essential (primary) hypertension: Secondary | ICD-10-CM | POA: Diagnosis not present

## 2019-06-10 DIAGNOSIS — Z0000 Encounter for general adult medical examination without abnormal findings: Secondary | ICD-10-CM | POA: Diagnosis not present

## 2019-06-10 DIAGNOSIS — E876 Hypokalemia: Secondary | ICD-10-CM | POA: Diagnosis not present

## 2019-06-14 ENCOUNTER — Other Ambulatory Visit: Payer: Self-pay | Admitting: Family Medicine

## 2019-06-14 DIAGNOSIS — E2839 Other primary ovarian failure: Secondary | ICD-10-CM

## 2019-06-15 DIAGNOSIS — H5213 Myopia, bilateral: Secondary | ICD-10-CM | POA: Diagnosis not present

## 2019-06-15 DIAGNOSIS — H25013 Cortical age-related cataract, bilateral: Secondary | ICD-10-CM | POA: Diagnosis not present

## 2019-06-15 DIAGNOSIS — H2513 Age-related nuclear cataract, bilateral: Secondary | ICD-10-CM | POA: Diagnosis not present

## 2019-06-15 DIAGNOSIS — H0102A Squamous blepharitis right eye, upper and lower eyelids: Secondary | ICD-10-CM | POA: Diagnosis not present

## 2019-06-15 DIAGNOSIS — H04123 Dry eye syndrome of bilateral lacrimal glands: Secondary | ICD-10-CM | POA: Diagnosis not present

## 2019-06-15 DIAGNOSIS — H40023 Open angle with borderline findings, high risk, bilateral: Secondary | ICD-10-CM | POA: Diagnosis not present

## 2019-06-15 DIAGNOSIS — H11153 Pinguecula, bilateral: Secondary | ICD-10-CM | POA: Diagnosis not present

## 2019-06-15 DIAGNOSIS — H18413 Arcus senilis, bilateral: Secondary | ICD-10-CM | POA: Diagnosis not present

## 2019-06-15 DIAGNOSIS — H353131 Nonexudative age-related macular degeneration, bilateral, early dry stage: Secondary | ICD-10-CM | POA: Diagnosis not present

## 2019-06-15 DIAGNOSIS — H0102B Squamous blepharitis left eye, upper and lower eyelids: Secondary | ICD-10-CM | POA: Diagnosis not present

## 2019-06-28 ENCOUNTER — Ambulatory Visit: Payer: Medicare Other | Attending: Internal Medicine

## 2019-06-28 DIAGNOSIS — Z23 Encounter for immunization: Secondary | ICD-10-CM

## 2019-06-28 NOTE — Progress Notes (Signed)
Patient received Moderna COVID Vaccine  Lot number [id number].  Pfizer was documented in error.  

## 2019-06-30 DIAGNOSIS — Z1211 Encounter for screening for malignant neoplasm of colon: Secondary | ICD-10-CM | POA: Diagnosis not present

## 2019-07-26 ENCOUNTER — Ambulatory Visit: Payer: Medicare Other | Attending: Internal Medicine

## 2019-07-26 DIAGNOSIS — Z23 Encounter for immunization: Secondary | ICD-10-CM

## 2019-07-26 NOTE — Progress Notes (Signed)
   Covid-19 Vaccination Clinic  Name:  Anaise Belch    MRN: [id number] DOB: 11-05-1950  07/26/2019  Ms. Eltzroth was observed post Covid-19 immunization for 15 minutes without incident. She was provided with Vaccine Information Sheet and instruction to access the V-Safe system.   Ms. Mckennon was instructed to call 911 with any severe reactions post vaccine: Marland Kitchen Difficulty breathing  . Swelling of face and throat  . A fast heartbeat  . A bad rash all over body  . Dizziness and weakness   Immunizations Administered    Name Date Dose VIS Date Route   Moderna COVID-19 Vaccine 07/26/2019  9:34 AM 0.5 mL 03/29/2019 Intramuscular   Manufacturer: Moderna   Lot: [id number]   [id number]

## 2019-08-05 DIAGNOSIS — H2513 Age-related nuclear cataract, bilateral: Secondary | ICD-10-CM | POA: Diagnosis not present

## 2019-08-05 DIAGNOSIS — H11153 Pinguecula, bilateral: Secondary | ICD-10-CM | POA: Diagnosis not present

## 2019-08-05 DIAGNOSIS — H0102A Squamous blepharitis right eye, upper and lower eyelids: Secondary | ICD-10-CM | POA: Diagnosis not present

## 2019-08-05 DIAGNOSIS — H40003 Preglaucoma, unspecified, bilateral: Secondary | ICD-10-CM | POA: Diagnosis not present

## 2019-08-05 DIAGNOSIS — H04123 Dry eye syndrome of bilateral lacrimal glands: Secondary | ICD-10-CM | POA: Diagnosis not present

## 2019-08-05 DIAGNOSIS — H0102B Squamous blepharitis left eye, upper and lower eyelids: Secondary | ICD-10-CM | POA: Diagnosis not present

## 2019-08-05 DIAGNOSIS — H40023 Open angle with borderline findings, high risk, bilateral: Secondary | ICD-10-CM | POA: Diagnosis not present

## 2019-08-05 DIAGNOSIS — H18413 Arcus senilis, bilateral: Secondary | ICD-10-CM | POA: Diagnosis not present

## 2019-08-05 DIAGNOSIS — H25013 Cortical age-related cataract, bilateral: Secondary | ICD-10-CM | POA: Diagnosis not present

## 2019-08-05 DIAGNOSIS — H353131 Nonexudative age-related macular degeneration, bilateral, early dry stage: Secondary | ICD-10-CM | POA: Diagnosis not present

## 2019-10-09 ENCOUNTER — Ambulatory Visit (HOSPITAL_COMMUNITY)
Admission: EM | Admit: 2019-10-09 | Discharge: 2019-10-09 | Disposition: A | Discharge status: Home / Self Care | Payer: Medicare Other

## 2019-10-09 ENCOUNTER — Encounter (HOSPITAL_COMMUNITY): Payer: Self-pay

## 2019-10-09 DIAGNOSIS — S0501XA Injury of conjunctiva and corneal abrasion without foreign body, right eye, initial encounter: Secondary | ICD-10-CM

## 2019-10-09 MED ORDER — FLUORESCEIN SODIUM 1 MG OP STRP
ORAL_STRIP | OPHTHALMIC | Status: AC
  Filled 2019-10-09: qty 1

## 2019-10-09 MED ORDER — EYE WASH OPHTH SOLN
OPHTHALMIC | Status: AC
  Filled 2019-10-09: qty 118

## 2019-10-09 MED ORDER — TETRACAINE HCL 0.5 % OP SOLN
OPHTHALMIC | Status: AC
  Filled 2019-10-09: qty 4

## 2019-10-09 MED ORDER — ERYTHROMYCIN 5 MG/GM OP OINT: TOPICAL_OINTMENT | OPHTHALMIC | 0 refills | Status: AC

## 2019-10-09 NOTE — Discharge Instructions (Signed)
Please use erythromycin ointment 4 times daily for the next week Warm compresses to eye  Please return if developing any increased pain, redness, swelling or changes in vision Please follow-up with your eye doctor

## 2019-10-09 NOTE — ED Notes (Signed)
Pt completed visual acuity exam with prescription eye glasses

## 2019-10-09 NOTE — ED Triage Notes (Addendum)
Pt presents today with right eye pain and irritation that began Friday. Pt states it "feels like there is something in there". Pt denies vision changes. Pt right eye visibly red and swollen. Pt states she wakes up with eye "stuck together" but only has "water" draining. Pt denies other discharge.

## 2019-10-09 NOTE — ED Provider Notes (Signed)
[facility]    CSN: [id number] Arrival date & time: 10/09/19  1249      History   Chief Complaint Chief Complaint  Patient presents with  . Eye Pain    HPI Paula Harper is a 69 y.o. female history of hypertension, presenting today for evaluation of right eye irritation.  Patient reports over the past 2 days she has had increased discomfort and redness in her right eye.  She has had associated swelling.  She reports rubbing her eye frequently due to discomfort.  She denies changes in vision.  Denies contact use.  Does wear glasses.  Does report a couple days of nasal congestion recently.  Denies cough or sore throat.  Reports clear watery drainage.  Denies itching.  Denies photophobia. Denies trauma/injury. Reports foreign body sensation.  HPI  Past Medical History:  Diagnosis Date  . Anemia   . Diabetes mellitus   . Fibroid   . H/O dysmenorrhea   . Heart murmur 1996  . Hypertension   . Menopausal symptoms 2001    Patient Active Problem List   Diagnosis Date Noted  . Symptomatic menopausal or female climacteric states 08/11/2011  . Hypertension 08/11/2011    Past Surgical History:  Procedure Laterality Date  . MYOMECTOMY  1990    OB History   No obstetric history on file.      Home Medications    Prior to Admission medications   Medication Sig Start Date End Date Taking? Authorizing Provider  atenolol (TENORMIN) 50 MG tablet Take 50 mg by mouth daily.   Yes [provider]  calcium-vitamin D (OSCAL WITH D) 500-200 MG-UNIT per tablet Take 1 tablet by mouth daily.   Yes [provider]  potassium chloride (KLOR-CON) 20 MEQ packet Take 20 mEq by mouth 2 (two) times daily.   Yes [provider]  rosuvastatin (CRESTOR) 5 MG tablet Take 5 mg by mouth daily. 10/07/19  Yes [provider]  triamterene-hydrochlorothiazide (MAXZIDE-25) 37.5-25 MG per tablet Take 1 tablet by mouth daily.   Yes [provider]    erythromycin ophthalmic ointment Place a 1/2 inch ribbon of ointment into the lower eyelid 4 times daily x 1 week 10/09/19   Luvena Wentling, Elesa Hacker, PA-C    Family History Family History  Problem Relation Age of Onset  . Heart disease Father   . Breast cancer Neg Hx     Social History Social History   Tobacco Use  . Smoking status: Never Smoker  . Smokeless tobacco: Never Used  Vaping Use  . Vaping Use: Never used  Substance Use Topics  . Alcohol use: No  . Drug use: No     Allergies   Patient has no known allergies.   Review of Systems Review of Systems  Constitutional: Negative for activity change, appetite change, chills, fatigue and fever.  HENT: Negative for congestion, ear pain, rhinorrhea, sinus pressure, sore throat and trouble swallowing.   Eyes: Positive for discharge and redness. Negative for photophobia, pain, itching and visual disturbance.  Respiratory: Negative for cough, chest tightness and shortness of breath.   Cardiovascular: Negative for chest pain.  Gastrointestinal: Negative for abdominal pain, diarrhea, nausea and vomiting.  Musculoskeletal: Negative for myalgias.  Skin: Negative for rash.  Neurological: Negative for dizziness, light-headedness and headaches.     Physical Exam Triage Vital Signs ED Triage Vitals [10/09/19 1300]  Enc Vitals Group     BP 133/84     Pulse Rate 69  Resp      Temp (!) 97.3 F (36.3 C)     Temp Source Oral     SpO2      Weight      Height      Head Circumference      Peak Flow      Pain Score 0     Pain Loc      Pain Edu?      Excl. in [place]?    No data found.  Updated Vital Signs BP 133/84 (BP Location: Right Arm)   Pulse 69   Temp (!) 97.3 F (36.3 C) (Oral)   Visual Acuity Right Eye Distance: 20/40 Left Eye Distance: 20/30 Bilateral Distance: 20/30  Right Eye Near:   Left Eye Near:    Bilateral Near:     Physical Exam Vitals and nursing note reviewed.  Constitutional:      Appearance:  She is well-developed.     Comments: No acute distress  HENT:     Head: Normocephalic and atraumatic.     Ears:     Comments: Bilateral cerumen impaction    Nose: Nose normal.  Eyes:     Extraocular Movements: Extraocular movements intact.     Conjunctiva/sclera: Conjunctivae normal.     Pupils: Pupils are equal, round, and reactive to light.     Comments: Corneal arcus present bilaterally  Right eye appears erythematous and injected, anterior chamber clear, mild lid swelling without erythema  Small area of fluorescein uptake noted centrally over pupil on cornea  Cardiovascular:     Rate and Rhythm: Normal rate.  Pulmonary:     Effort: Pulmonary effort is normal. No respiratory distress.  Abdominal:     General: There is no distension.  Musculoskeletal:        General: Normal range of motion.     Cervical back: Neck supple.  Skin:    General: Skin is warm and dry.  Neurological:     Mental Status: She is alert and oriented to person, place, and time.      UC Treatments / Results  Labs (all labs ordered are listed, but only abnormal results are displayed) Labs Reviewed - No data to display  EKG   Radiology No results found.  Procedures Procedures (including critical care time)  Medications Ordered in UC Medications - No data to display  Initial Impression / Assessment and Plan / UC Course  I have reviewed the triage vital signs and the nursing notes.  Pertinent labs & imaging results that were available during my care of the patient were reviewed by me and considered in my medical decision making (see chart for details).     Treating for corneal abrasion-erythromycin ointment, warm compresses.  Continue to monitor for gradual improvement, no red flags for ocular emergency.  Recommended to follow-up with ophthalmology.  Return here or emergency room if developing changing or worsening symptoms.  Discussed strict return precautions. Patient verbalized  understanding and is agreeable with plan.  Final Clinical Impressions(s) / UC Diagnoses   Final diagnoses:  Abrasion of right cornea, initial encounter     Discharge Instructions     Please use erythromycin ointment 4 times daily for the next week Warm compresses to eye  Please return if developing any increased pain, redness, swelling or changes in vision Please follow-up with your eye doctor    ED Prescriptions    Medication Sig Dispense Auth. Provider   erythromycin ophthalmic ointment Place a 1/2 inch ribbon  of ointment into the lower eyelid 4 times daily x 1 week 3.5 g Licet Dunphy, [place] C, PA-C     PDMP not reviewed this encounter.   Janith Lima, PA-C 10/09/19 1332

## 2019-11-11 ENCOUNTER — Other Ambulatory Visit: Payer: Medicare Other

## 2020-01-13 ENCOUNTER — Ambulatory Visit
Admission: RE | Admit: 2020-01-13 | Discharge: 2020-01-13 | Disposition: A | Discharge status: Home / Self Care | Payer: Medicare Other | Source: Ambulatory Visit | Attending: Family Medicine | Admitting: Family Medicine

## 2020-01-13 ENCOUNTER — Other Ambulatory Visit: Payer: Self-pay

## 2020-01-13 DIAGNOSIS — Z780 Asymptomatic menopausal state: Secondary | ICD-10-CM | POA: Diagnosis not present

## 2020-01-13 DIAGNOSIS — E2839 Other primary ovarian failure: Secondary | ICD-10-CM

## 2020-01-13 DIAGNOSIS — M85851 Other specified disorders of bone density and structure, right thigh: Secondary | ICD-10-CM | POA: Diagnosis not present

## 2020-02-03 ENCOUNTER — Other Ambulatory Visit: Payer: Self-pay | Admitting: Family Medicine

## 2020-02-03 DIAGNOSIS — Z1231 Encounter for screening mammogram for malignant neoplasm of breast: Secondary | ICD-10-CM

## 2020-03-09 DIAGNOSIS — H0102A Squamous blepharitis right eye, upper and lower eyelids: Secondary | ICD-10-CM | POA: Diagnosis not present

## 2020-03-09 DIAGNOSIS — H25813 Combined forms of age-related cataract, bilateral: Secondary | ICD-10-CM | POA: Diagnosis not present

## 2020-03-09 DIAGNOSIS — H04123 Dry eye syndrome of bilateral lacrimal glands: Secondary | ICD-10-CM | POA: Diagnosis not present

## 2020-03-09 DIAGNOSIS — H524 Presbyopia: Secondary | ICD-10-CM | POA: Diagnosis not present

## 2020-03-09 DIAGNOSIS — H40023 Open angle with borderline findings, high risk, bilateral: Secondary | ICD-10-CM | POA: Diagnosis not present

## 2020-03-09 DIAGNOSIS — H0102B Squamous blepharitis left eye, upper and lower eyelids: Secondary | ICD-10-CM | POA: Diagnosis not present

## 2020-03-09 DIAGNOSIS — H353131 Nonexudative age-related macular degeneration, bilateral, early dry stage: Secondary | ICD-10-CM | POA: Diagnosis not present

## 2020-04-06 ENCOUNTER — Other Ambulatory Visit: Payer: Self-pay

## 2020-04-06 ENCOUNTER — Ambulatory Visit
Admission: RE | Admit: 2020-04-06 | Discharge: 2020-04-06 | Disposition: A | Discharge status: Home / Self Care | Payer: Medicare Other | Source: Ambulatory Visit | Attending: Family Medicine | Admitting: Family Medicine

## 2020-04-06 DIAGNOSIS — Z1231 Encounter for screening mammogram for malignant neoplasm of breast: Secondary | ICD-10-CM | POA: Diagnosis not present

## 2020-06-22 DIAGNOSIS — I10 Essential (primary) hypertension: Secondary | ICD-10-CM | POA: Diagnosis not present

## 2020-06-22 DIAGNOSIS — E7800 Pure hypercholesterolemia, unspecified: Secondary | ICD-10-CM | POA: Diagnosis not present

## 2020-06-22 DIAGNOSIS — E876 Hypokalemia: Secondary | ICD-10-CM | POA: Diagnosis not present

## 2020-06-22 DIAGNOSIS — Z79899 Other long term (current) drug therapy: Secondary | ICD-10-CM | POA: Diagnosis not present

## 2020-06-22 DIAGNOSIS — R7309 Other abnormal glucose: Secondary | ICD-10-CM | POA: Diagnosis not present

## 2020-06-22 DIAGNOSIS — Z0001 Encounter for general adult medical examination with abnormal findings: Secondary | ICD-10-CM | POA: Diagnosis not present

## 2020-11-26 DIAGNOSIS — U071 COVID-19: Secondary | ICD-10-CM | POA: Diagnosis not present

## 2020-12-03 DIAGNOSIS — U071 COVID-19: Secondary | ICD-10-CM | POA: Diagnosis not present

## 2021-01-04 DIAGNOSIS — Z01419 Encounter for gynecological examination (general) (routine) without abnormal findings: Secondary | ICD-10-CM | POA: Diagnosis not present

## 2021-01-04 DIAGNOSIS — Z124 Encounter for screening for malignant neoplasm of cervix: Secondary | ICD-10-CM | POA: Diagnosis not present

## 2021-03-15 ENCOUNTER — Other Ambulatory Visit: Payer: Self-pay | Admitting: Family Medicine

## 2021-03-15 DIAGNOSIS — Z1231 Encounter for screening mammogram for malignant neoplasm of breast: Secondary | ICD-10-CM

## 2021-04-26 ENCOUNTER — Ambulatory Visit
Admission: RE | Admit: 2021-04-26 | Discharge: 2021-04-26 | Disposition: A | Discharge status: Home / Self Care | Payer: Medicare Other | Source: Ambulatory Visit | Attending: Family Medicine | Admitting: Family Medicine

## 2021-04-26 DIAGNOSIS — Z1231 Encounter for screening mammogram for malignant neoplasm of breast: Secondary | ICD-10-CM | POA: Diagnosis not present

## 2022-01-30 DIAGNOSIS — Z124 Encounter for screening for malignant neoplasm of cervix: Secondary | ICD-10-CM | POA: Diagnosis not present

## 2022-01-30 DIAGNOSIS — Z01419 Encounter for gynecological examination (general) (routine) without abnormal findings: Secondary | ICD-10-CM | POA: Diagnosis not present

## 2022-01-30 DIAGNOSIS — Z13820 Encounter for screening for osteoporosis: Secondary | ICD-10-CM | POA: Diagnosis not present

## 2022-02-04 ENCOUNTER — Other Ambulatory Visit: Payer: Self-pay | Admitting: Obstetrics and Gynecology

## 2022-02-04 DIAGNOSIS — Z13820 Encounter for screening for osteoporosis: Secondary | ICD-10-CM

## 2022-04-02 ENCOUNTER — Other Ambulatory Visit: Payer: Self-pay | Admitting: Family Medicine

## 2022-04-02 DIAGNOSIS — Z1231 Encounter for screening mammogram for malignant neoplasm of breast: Secondary | ICD-10-CM

## 2022-05-30 ENCOUNTER — Ambulatory Visit
Admission: RE | Admit: 2022-05-30 | Discharge: 2022-05-30 | Disposition: A | Discharge status: Home / Self Care | Payer: Medicare PPO | Source: Ambulatory Visit | Attending: Family Medicine | Admitting: Family Medicine

## 2022-05-30 DIAGNOSIS — Z1231 Encounter for screening mammogram for malignant neoplasm of breast: Secondary | ICD-10-CM | POA: Diagnosis not present

## 2022-08-15 DIAGNOSIS — I10 Essential (primary) hypertension: Secondary | ICD-10-CM | POA: Diagnosis not present

## 2022-08-15 DIAGNOSIS — E7800 Pure hypercholesterolemia, unspecified: Secondary | ICD-10-CM | POA: Diagnosis not present

## 2022-08-15 DIAGNOSIS — Z79899 Other long term (current) drug therapy: Secondary | ICD-10-CM | POA: Diagnosis not present

## 2022-08-15 DIAGNOSIS — Z0001 Encounter for general adult medical examination with abnormal findings: Secondary | ICD-10-CM | POA: Diagnosis not present

## 2022-08-15 DIAGNOSIS — E876 Hypokalemia: Secondary | ICD-10-CM | POA: Diagnosis not present

## 2022-08-15 DIAGNOSIS — R7301 Impaired fasting glucose: Secondary | ICD-10-CM | POA: Diagnosis not present

## 2022-08-15 DIAGNOSIS — H6123 Impacted cerumen, bilateral: Secondary | ICD-10-CM | POA: Diagnosis not present

## 2022-09-11 DIAGNOSIS — Z0001 Encounter for general adult medical examination with abnormal findings: Secondary | ICD-10-CM | POA: Diagnosis not present

## 2023-03-06 DIAGNOSIS — Z6822 Body mass index (BMI) 22.0-22.9, adult: Secondary | ICD-10-CM | POA: Diagnosis not present

## 2023-03-06 DIAGNOSIS — Z124 Encounter for screening for malignant neoplasm of cervix: Secondary | ICD-10-CM | POA: Diagnosis not present

## 2023-03-06 DIAGNOSIS — Z01419 Encounter for gynecological examination (general) (routine) without abnormal findings: Secondary | ICD-10-CM | POA: Diagnosis not present

## 2023-07-07 ENCOUNTER — Other Ambulatory Visit: Payer: Self-pay | Admitting: Family Medicine

## 2023-07-07 DIAGNOSIS — Z1231 Encounter for screening mammogram for malignant neoplasm of breast: Secondary | ICD-10-CM

## 2023-07-31 DIAGNOSIS — R7301 Impaired fasting glucose: Secondary | ICD-10-CM | POA: Diagnosis not present

## 2023-07-31 DIAGNOSIS — I10 Essential (primary) hypertension: Secondary | ICD-10-CM | POA: Diagnosis not present

## 2023-07-31 DIAGNOSIS — E2839 Other primary ovarian failure: Secondary | ICD-10-CM | POA: Diagnosis not present

## 2023-07-31 DIAGNOSIS — E7800 Pure hypercholesterolemia, unspecified: Secondary | ICD-10-CM | POA: Diagnosis not present

## 2023-07-31 DIAGNOSIS — Z79899 Other long term (current) drug therapy: Secondary | ICD-10-CM | POA: Diagnosis not present

## 2023-07-31 DIAGNOSIS — Z0000 Encounter for general adult medical examination without abnormal findings: Secondary | ICD-10-CM | POA: Diagnosis not present

## 2023-07-31 DIAGNOSIS — E876 Hypokalemia: Secondary | ICD-10-CM | POA: Diagnosis not present

## 2023-08-04 ENCOUNTER — Other Ambulatory Visit: Payer: Self-pay | Admitting: Family Medicine

## 2023-08-04 DIAGNOSIS — E2839 Other primary ovarian failure: Secondary | ICD-10-CM

## 2023-08-14 ENCOUNTER — Ambulatory Visit
Admission: RE | Admit: 2023-08-14 | Discharge: 2023-08-14 | Disposition: A | Discharge status: Home / Self Care | Source: Ambulatory Visit | Attending: Family Medicine | Admitting: Family Medicine

## 2023-08-14 DIAGNOSIS — Z1231 Encounter for screening mammogram for malignant neoplasm of breast: Secondary | ICD-10-CM

## 2023-08-20 DIAGNOSIS — Z0001 Encounter for general adult medical examination with abnormal findings: Secondary | ICD-10-CM | POA: Diagnosis not present

## 2024-04-29 ENCOUNTER — Other Ambulatory Visit: Payer: Self-pay

## 2024-05-03 ENCOUNTER — Other Ambulatory Visit (HOSPITAL_BASED_OUTPATIENT_CLINIC_OR_DEPARTMENT_OTHER): Payer: Self-pay
